# Patient Record
Sex: Female | Born: 2000 | Race: White | Hispanic: No | Marital: Single | Smoking: Former smoker
Health system: Southern US, Community
[De-identification: ages and names within clinical notes are randomized; demographics above are authoritative.]

## PROBLEM LIST (undated history)

## (undated) DIAGNOSIS — E669 Obesity, unspecified: Secondary | ICD-10-CM

## (undated) DIAGNOSIS — N760 Acute vaginitis: Secondary | ICD-10-CM

## (undated) DIAGNOSIS — B9689 Other specified bacterial agents as the cause of diseases classified elsewhere: Secondary | ICD-10-CM

## (undated) DIAGNOSIS — F909 Attention-deficit hyperactivity disorder, unspecified type: Secondary | ICD-10-CM

## (undated) DIAGNOSIS — K219 Gastro-esophageal reflux disease without esophagitis: Secondary | ICD-10-CM

## (undated) DIAGNOSIS — A749 Chlamydial infection, unspecified: Secondary | ICD-10-CM

## (undated) HISTORY — DX: Chlamydial infection, unspecified: A74.9

## (undated) HISTORY — DX: Acute vaginitis: B96.89

## (undated) HISTORY — PX: NO PAST SURGERIES: SHX2092

## (undated) HISTORY — DX: Other specified bacterial agents as the cause of diseases classified elsewhere: N76.0

## (undated) HISTORY — DX: Attention-deficit hyperactivity disorder, unspecified type: F90.9

## (undated) HISTORY — DX: Obesity, unspecified: E66.9

---
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
## 2021-03-12 ENCOUNTER — Ambulatory Visit (INDEPENDENT_AMBULATORY_CARE_PROVIDER_SITE_OTHER): Payer: Medicaid Other | Admitting: Gastroenterology

## 2021-10-18 IMAGING — DX DG ABD PORTABLE 1V
3 series · 3 of 3 positions shown · non-contrast
Comparison: CT 06/17/2020

CLINICAL DATA: Generalized abdominal pain, nausea vomiting for 2
months

EXAM:
PORTABLE ABDOMEN - 1 VIEW

[abdomen supine (1 of 3)]
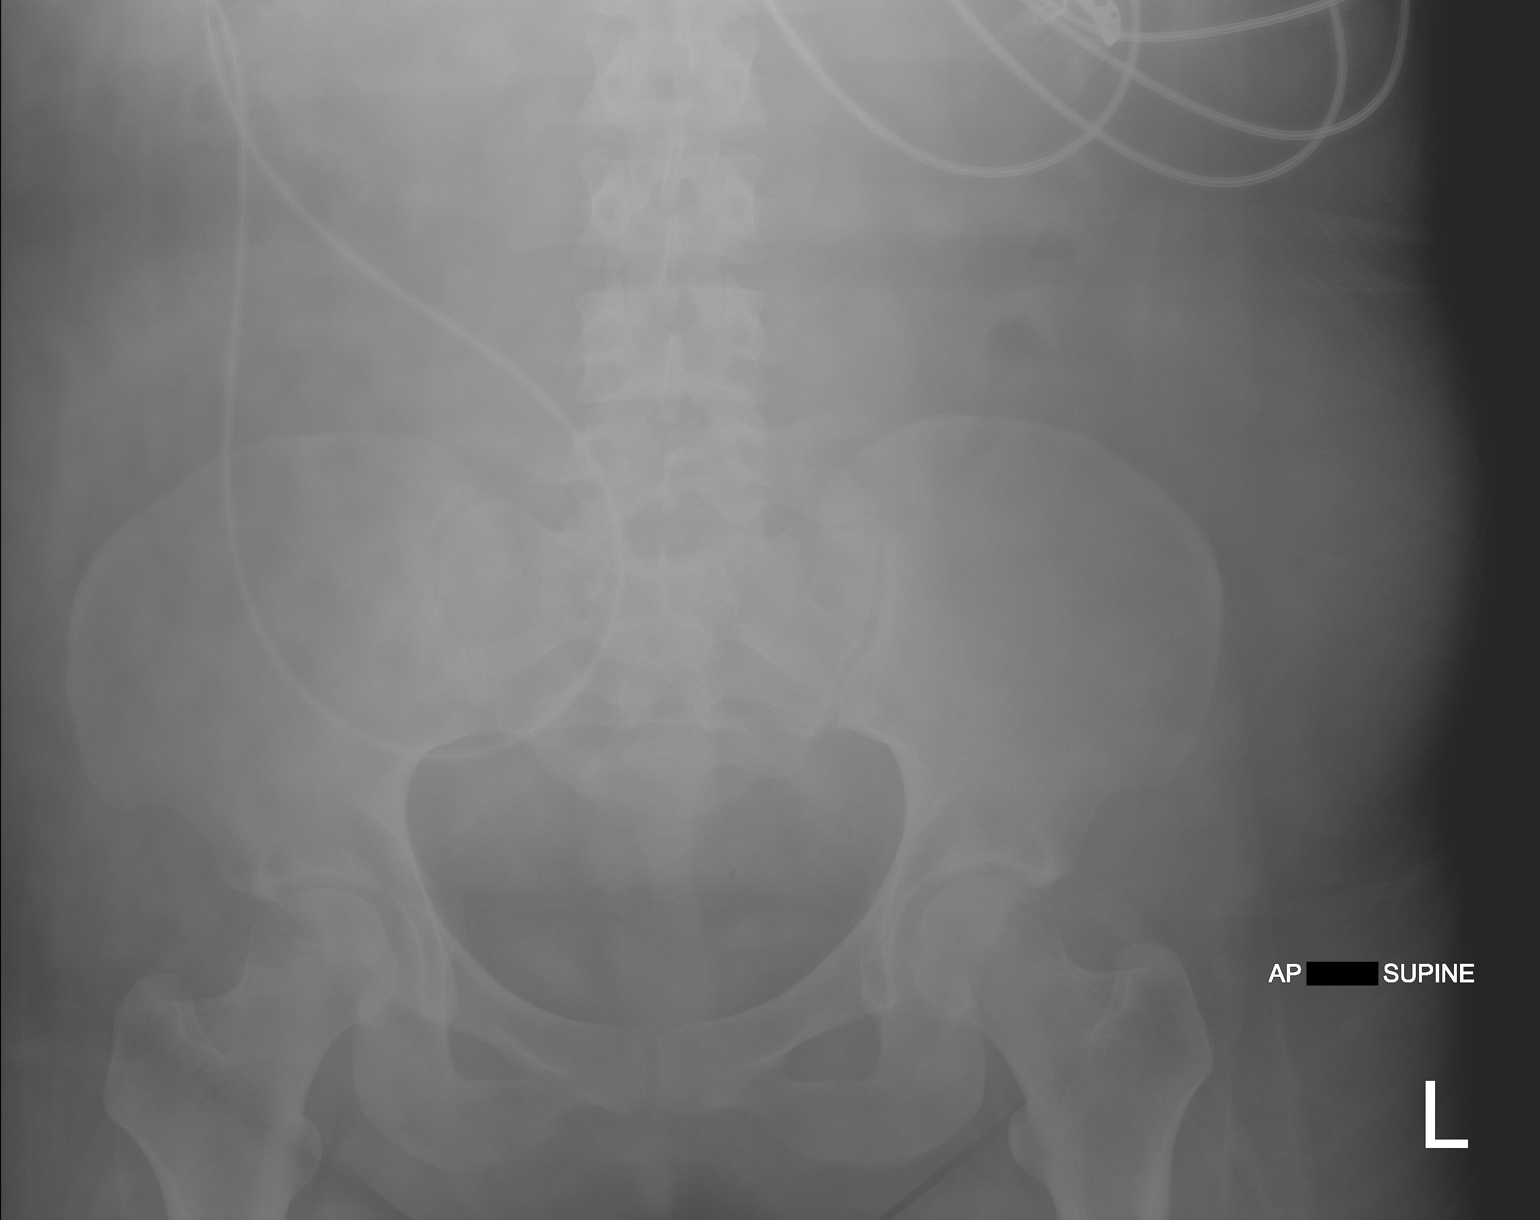

[abdomen supine (2 of 3)]
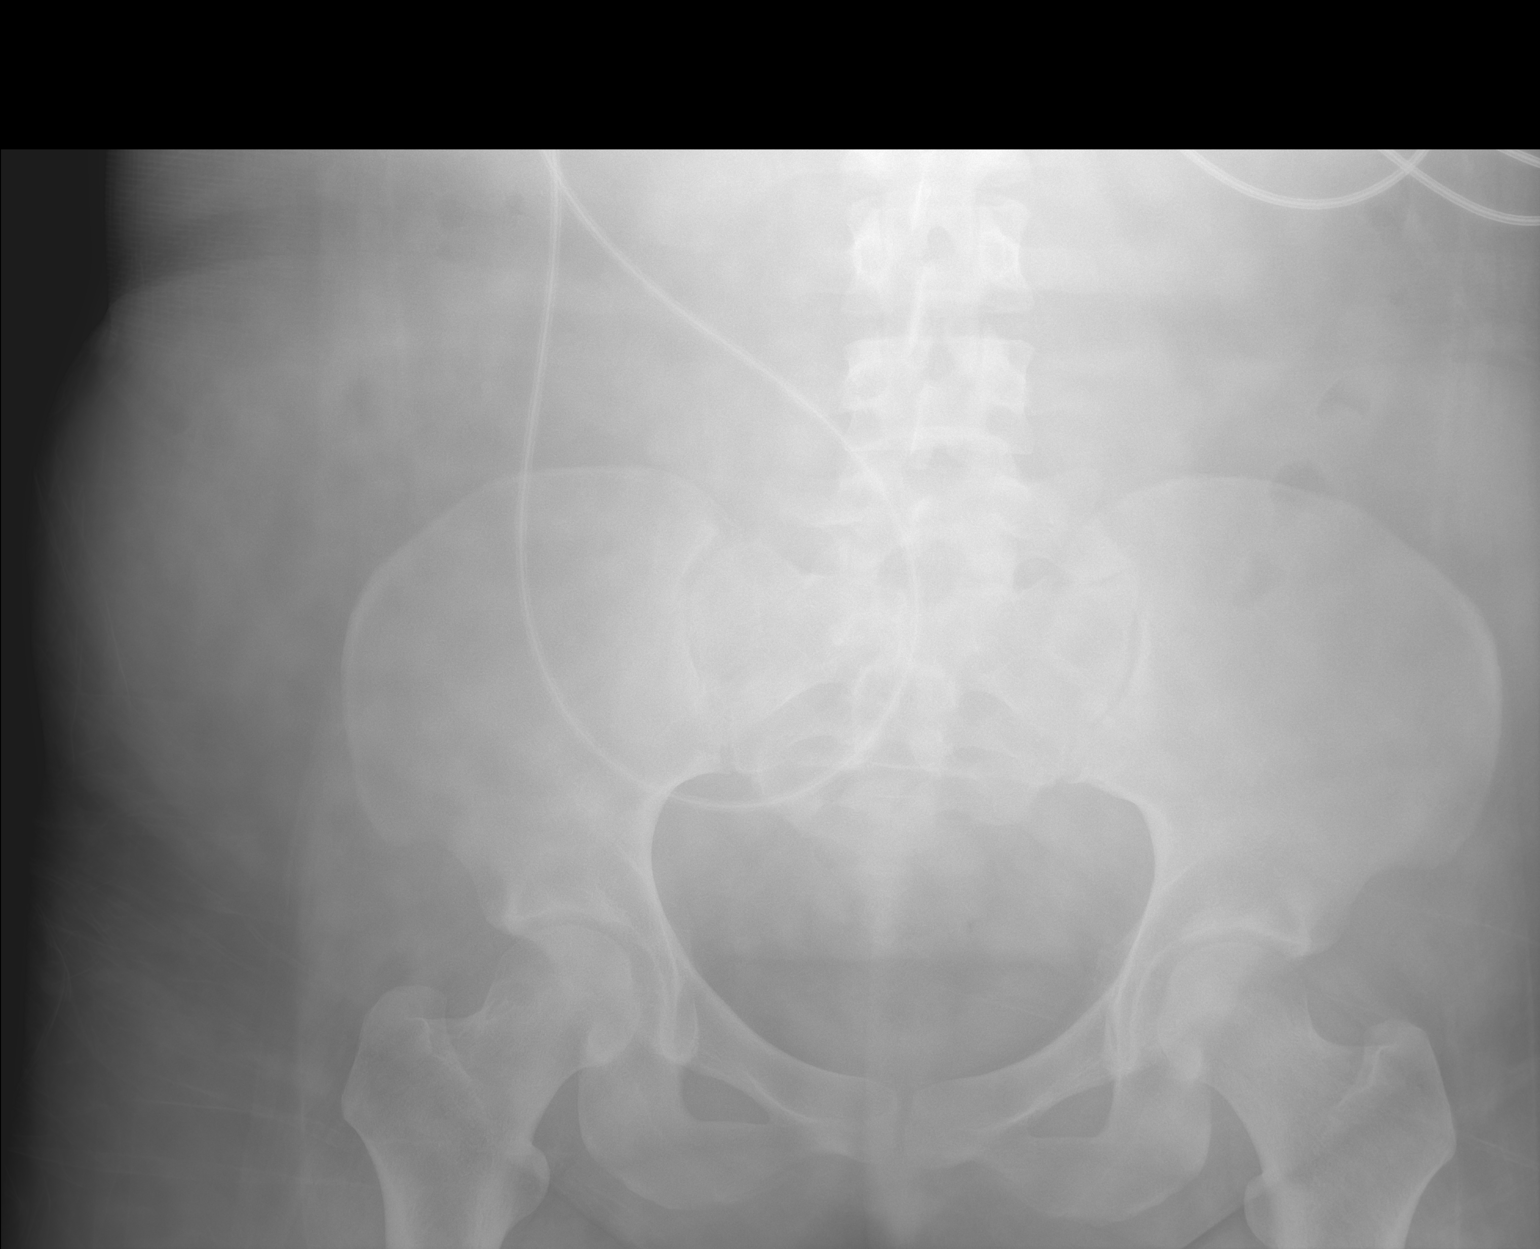

[abdomen supine (3 of 3)]
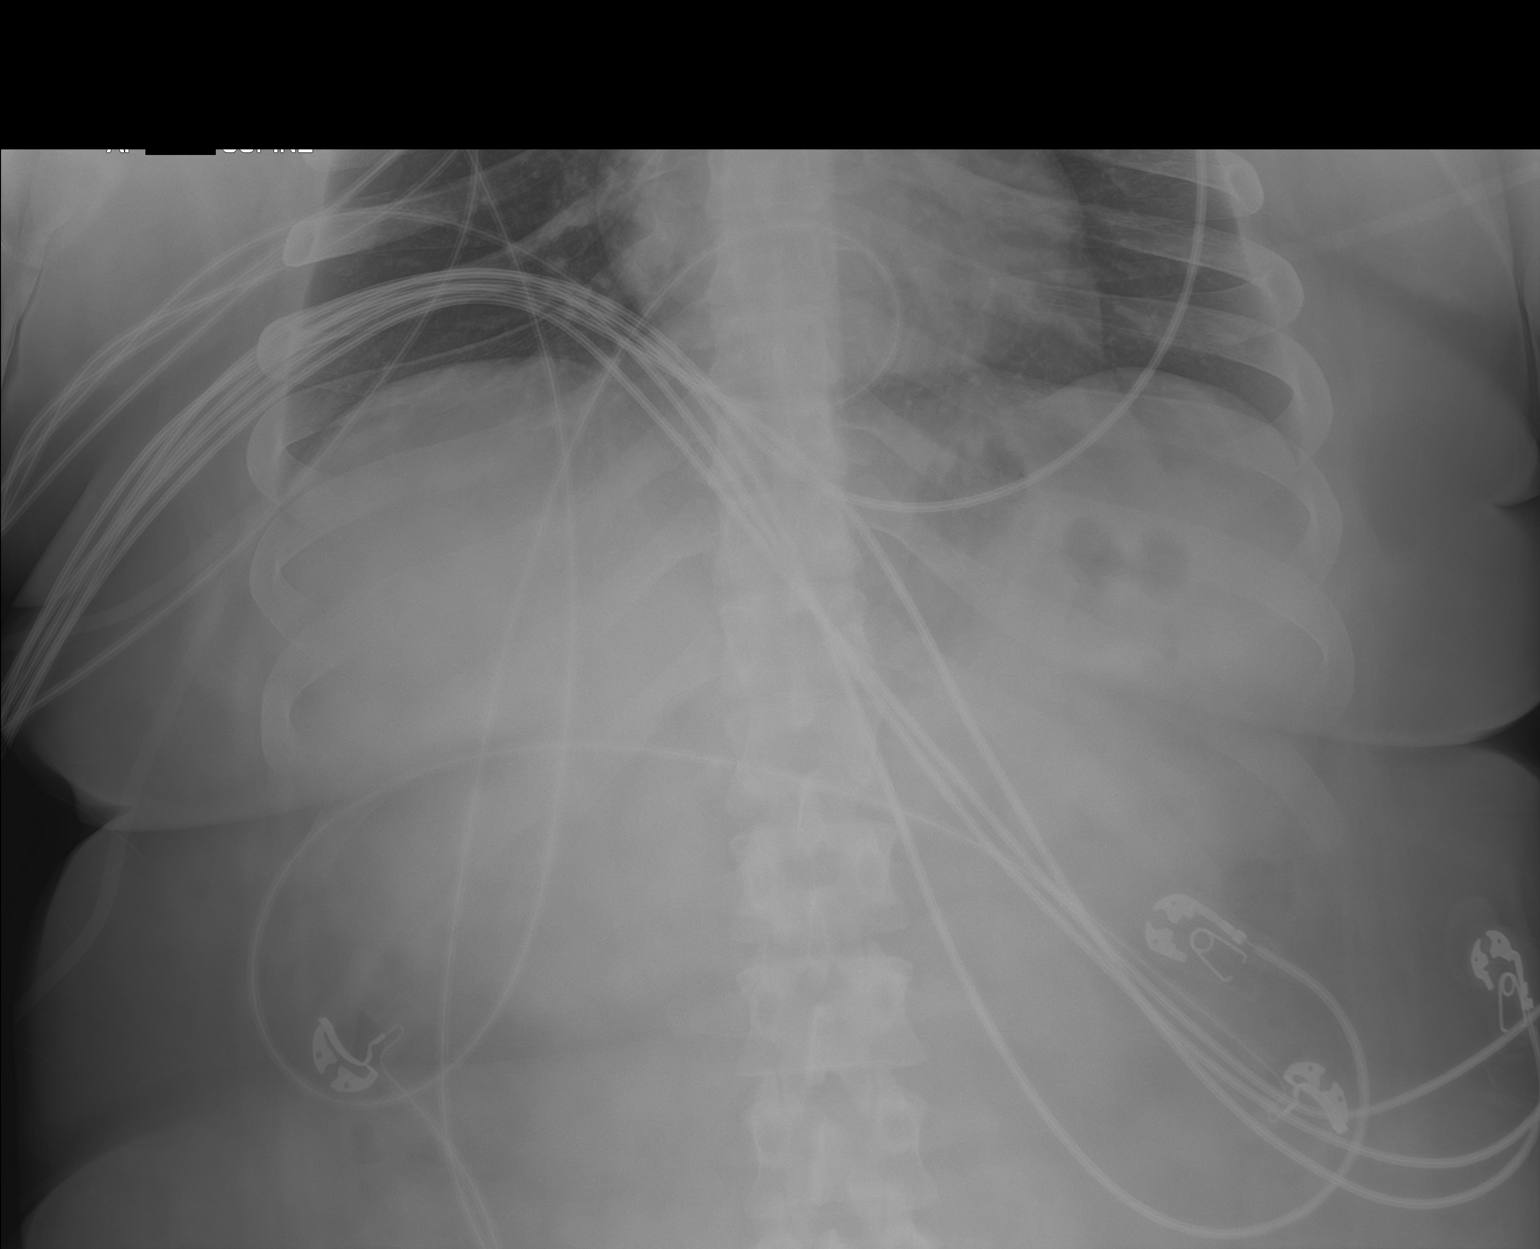

[3 of 3 positions shown; findings below may reference images not displayed]

FINDINGS: No high-grade obstructive bowel gas pattern or suspicious abdominal
calcifications. Limited assessment for free air on supine only
imaging without convincing secondary signs. Lung bases are clear. No
acute osseous or soft tissue abnormality.
IMPRESSION: Negative.

## 2021-11-01 ENCOUNTER — Ambulatory Visit: Payer: Medicaid Other | Admitting: Cardiology

## 2021-11-05 ENCOUNTER — Ambulatory Visit (INDEPENDENT_AMBULATORY_CARE_PROVIDER_SITE_OTHER): Payer: Medicaid Other | Admitting: Cardiology

## 2021-11-05 ENCOUNTER — Encounter: Payer: Self-pay | Admitting: Cardiology

## 2021-11-05 VITALS — BP 120/82 | HR 87 | Ht 61.0 in | Wt 276.0 lb

## 2021-11-05 DIAGNOSIS — R000 Tachycardia, unspecified: Secondary | ICD-10-CM

## 2021-11-05 NOTE — Patient Instructions (Signed)
Medication Instructions:  Your physician recommends that you continue on your current medications as directed. Please refer to the Current Medication list given to you today.   Labwork: None  Testing/Procedures: None  Follow-Up: Follow up as needed with Dr. Branch  Any Other Special Instructions Will Be Listed Below (If Applicable).     If you need a refill on your cardiac medications before your next appointment, please call your pharmacy.  

## 2021-11-05 NOTE — Progress Notes (Signed)
Clinical Summary Meghan Potts is a 21 y.o.female seen today as a new consult, referred by Dr Reuel Boom for the following medical problems.   1.Sinus tachycardia - noted 08/24/2020 during admission with N/V, AMS, Wernicke's encephalopathy - echo was benign, started on lopressor at that time.  -history of significant anxiety  - she is off metoprolol over 6 months ago, felt wasn't needed at the time.  - no evidence of recurrent tachycardia.    2. Encephalopathy - prolonged admission last year with extensive workup for AMS, balance instability, etc - continues to follow with neuro - from notes had some issues with sinus tachycardia, elevated bp's during this admission in setting of systemic illness. No recurrent issues over the last several months   3. ADHD - asked to evaluate for cardiac clearance to start possible ADHD meds, either stimulans, SSRI/SNRI   Past Medical History:  Diagnosis Date   Acid reflux    ADHD    Bacterial vaginosis    Chlamydia    Obesity      Allergies  Allergen Reactions   Ceftin [Cefuroxime] Rash     Current Outpatient Medications  Medication Sig Dispense Refill   aspirin-acetaminophen-caffeine (EXCEDRIN MIGRAINE) 250-250-65 MG tablet Take 2 tablets by mouth every 6 (six) hours as needed for headache or migraine.     hydrOXYzine (ATARAX/VISTARIL) 25 MG tablet Take 25 mg by mouth 2 (two) times daily.     medroxyPROGESTERone (DEPO-PROVERA) 150 MG/ML injection Inject 150 mg into the muscle every 3 (three) months.     metoprolol succinate (TOPROL-XL) 50 MG 24 hr tablet Take 50 mg by mouth daily. Take with or immediately following a meal.     Multiple Vitamins-Minerals (MULTI COMPLETE PO) Take by mouth. Once per day.     omeprazole (PRILOSEC) 20 MG capsule Take 20 mg by mouth daily.     OVER THE COUNTER MEDICATION Vit D 3 5000 once per day.     OVER THE COUNTER MEDICATION B 1 100 mg once per day.     OVER THE COUNTER MEDICATION B12 100 mcg once per  day.     sertraline (ZOLOFT) 25 MG tablet Take 75 mg by mouth daily.     triamcinolone (KENALOG) 0.1 % Apply 1 application topically 3 (three) times daily. (Patient not taking: No sig reported)     No current facility-administered medications for this visit.     Past Surgical History:  Procedure Laterality Date   NO PAST SURGERIES       Allergies  Allergen Reactions   Ceftin [Cefuroxime] Rash      Family History  Problem Relation Age of Onset   Obesity Mother    Crohn's disease Paternal Aunt    Crohn's disease Maternal Grandmother    Diabetes Maternal Grandfather    Diabetes Paternal Grandmother    Colon cancer Neg Hx    Esophageal cancer Neg Hx    Stomach cancer Neg Hx    Rectal cancer Neg Hx      Social History Ms. Berzins reports that she has quit smoking. Her smoking use included cigarettes. She has never used smokeless tobacco. Ms. Lobb reports that she does not currently use alcohol.   Review of Systems CONSTITUTIONAL: No weight loss, fever, chills, weakness or fatigue.  HEENT: Eyes: No visual loss, blurred vision, double vision or yellow sclerae.No hearing loss, sneezing, congestion, runny nose or sore throat.  SKIN: No rash or itching.  CARDIOVASCULAR: per hpi RESPIRATORY: No shortness of breath,  cough or sputum.  GASTROINTESTINAL: No anorexia, nausea, vomiting or diarrhea. No abdominal pain or blood.  GENITOURINARY: No burning on urination, no polyuria NEUROLOGICAL: No headache, dizziness, syncope, paralysis, ataxia, numbness or tingling in the extremities. No change in bowel or bladder control.  MUSCULOSKELETAL: No muscle, back pain, joint pain or stiffness.  LYMPHATICS: No enlarged nodes. No history of splenectomy.  PSYCHIATRIC: No history of depression or anxiety.  ENDOCRINOLOGIC: No reports of sweating, cold or heat intolerance. No polyuria or polydipsia.  Marland Kitchen   Physical Examination Today's Vitals   11/05/21 1035  BP: 120/82  Pulse: 87  SpO2:  98%  Weight: 276 lb (125.2 kg)  Height: 5\' 1"  (1.549 m)   Body mass index is 52.15 kg/m.  Gen: resting comfortably, no acute distress HEENT: no scleral icterus, pupils equal round and reactive, no palptable cervical adenopathy,  CV: RRR, no m/r/g no jvd Resp: Clear to auscultation bilaterally GI: abdomen is soft, non-tender, non-distended, normal bowel sounds, no hepatosplenomegaly MSK: extremities are warm, no edema.  Skin: warm, no rash Neuro:  no focal deficits Psych: appropriate affect   Diagnostic Studies 08/2020 echo SUMMARY  Left ventricular systolic function is normal.  LV ejection fraction = 55-60%.  There is trace mitral regurgitation.  There is trace tricuspid regurgitation.  There is no pericardial effusion.  There is no comparison study available.       Assessment and Plan   1.Sinus tachycardia - noted during admission last year in setting of significant systemic illness. Temporarily on beta blocker, off for several months with no recurrent tachycardia - EKG today shows NSR - suspect tachy was in response to systemic illness, no need for any further evaluation or management  -she is ok from cardiac standpiont for any ADHD medication. Her baseline HRs are normal, normal QTc on EKG, structurally normal heart by echo   May f/u just as needed.        June, M.D.

## 2022-01-17 DIAGNOSIS — F419 Anxiety disorder, unspecified: Secondary | ICD-10-CM | POA: Diagnosis not present

## 2022-01-17 DIAGNOSIS — F900 Attention-deficit hyperactivity disorder, predominantly inattentive type: Secondary | ICD-10-CM | POA: Diagnosis not present

## 2022-01-17 DIAGNOSIS — R69 Illness, unspecified: Secondary | ICD-10-CM | POA: Diagnosis not present

## 2022-01-28 DIAGNOSIS — Z23 Encounter for immunization: Secondary | ICD-10-CM | POA: Diagnosis not present

## 2022-02-22 DIAGNOSIS — F900 Attention-deficit hyperactivity disorder, predominantly inattentive type: Secondary | ICD-10-CM | POA: Diagnosis not present

## 2022-02-22 DIAGNOSIS — F419 Anxiety disorder, unspecified: Secondary | ICD-10-CM | POA: Diagnosis not present

## 2022-02-22 DIAGNOSIS — R69 Illness, unspecified: Secondary | ICD-10-CM | POA: Diagnosis not present

## 2022-03-04 DIAGNOSIS — F331 Major depressive disorder, recurrent, moderate: Secondary | ICD-10-CM | POA: Diagnosis not present

## 2022-03-04 DIAGNOSIS — M25561 Pain in right knee: Secondary | ICD-10-CM | POA: Diagnosis not present

## 2022-03-04 DIAGNOSIS — E559 Vitamin D deficiency, unspecified: Secondary | ICD-10-CM | POA: Diagnosis not present

## 2022-03-04 DIAGNOSIS — R030 Elevated blood-pressure reading, without diagnosis of hypertension: Secondary | ICD-10-CM | POA: Diagnosis not present

## 2022-03-04 DIAGNOSIS — Z6841 Body Mass Index (BMI) 40.0 and over, adult: Secondary | ICD-10-CM | POA: Diagnosis not present

## 2022-03-04 DIAGNOSIS — K2100 Gastro-esophageal reflux disease with esophagitis, without bleeding: Secondary | ICD-10-CM | POA: Diagnosis not present

## 2022-03-04 DIAGNOSIS — R4582 Worries: Secondary | ICD-10-CM | POA: Diagnosis not present

## 2022-03-04 DIAGNOSIS — E512 Wernicke's encephalopathy: Secondary | ICD-10-CM | POA: Diagnosis not present

## 2022-03-04 DIAGNOSIS — R69 Illness, unspecified: Secondary | ICD-10-CM | POA: Diagnosis not present

## 2022-03-04 DIAGNOSIS — E538 Deficiency of other specified B group vitamins: Secondary | ICD-10-CM | POA: Diagnosis not present

## 2022-03-04 DIAGNOSIS — F900 Attention-deficit hyperactivity disorder, predominantly inattentive type: Secondary | ICD-10-CM | POA: Diagnosis not present

## 2022-04-05 DIAGNOSIS — R69 Illness, unspecified: Secondary | ICD-10-CM | POA: Diagnosis not present

## 2022-05-17 DIAGNOSIS — Z20828 Contact with and (suspected) exposure to other viral communicable diseases: Secondary | ICD-10-CM | POA: Diagnosis not present

## 2022-05-17 DIAGNOSIS — Z6841 Body Mass Index (BMI) 40.0 and over, adult: Secondary | ICD-10-CM | POA: Diagnosis not present

## 2022-05-17 DIAGNOSIS — R030 Elevated blood-pressure reading, without diagnosis of hypertension: Secondary | ICD-10-CM | POA: Diagnosis not present

## 2022-05-17 DIAGNOSIS — R509 Fever, unspecified: Secondary | ICD-10-CM | POA: Diagnosis not present

## 2022-05-17 DIAGNOSIS — R69 Illness, unspecified: Secondary | ICD-10-CM | POA: Diagnosis not present
